# Patient Record
Sex: Male | Born: 1970 | Race: White | Hispanic: No | Marital: Married | Smoking: Current every day smoker
Health system: Southern US, Community
[De-identification: ages and names within clinical notes are randomized; demographics above are authoritative.]

## PROBLEM LIST (undated history)

## (undated) DIAGNOSIS — J449 Chronic obstructive pulmonary disease, unspecified: Secondary | ICD-10-CM

## (undated) HISTORY — PX: TESTICULAR EXPLORATION: SHX5145

---

## 2006-10-26 ENCOUNTER — Ambulatory Visit: Payer: Self-pay | Admitting: Specialist

## 2012-12-24 ENCOUNTER — Ambulatory Visit: Payer: Self-pay

## 2014-07-04 IMAGING — CR DG CHEST 2V
1 series · 3 of 3 positions shown · non-contrast
Comparison: none

REASON FOR EXAM: cough weight loss smoker
COMMENTS:

PROCEDURE:     DAIJA - DAIJA CHEST PA (OR AP) AND LAT  - December 24, 2012  [DATE]
RESULT:

[Series 1: pa · 0.17mm/px · 3 of 3 slices shown]
[im 1/3]
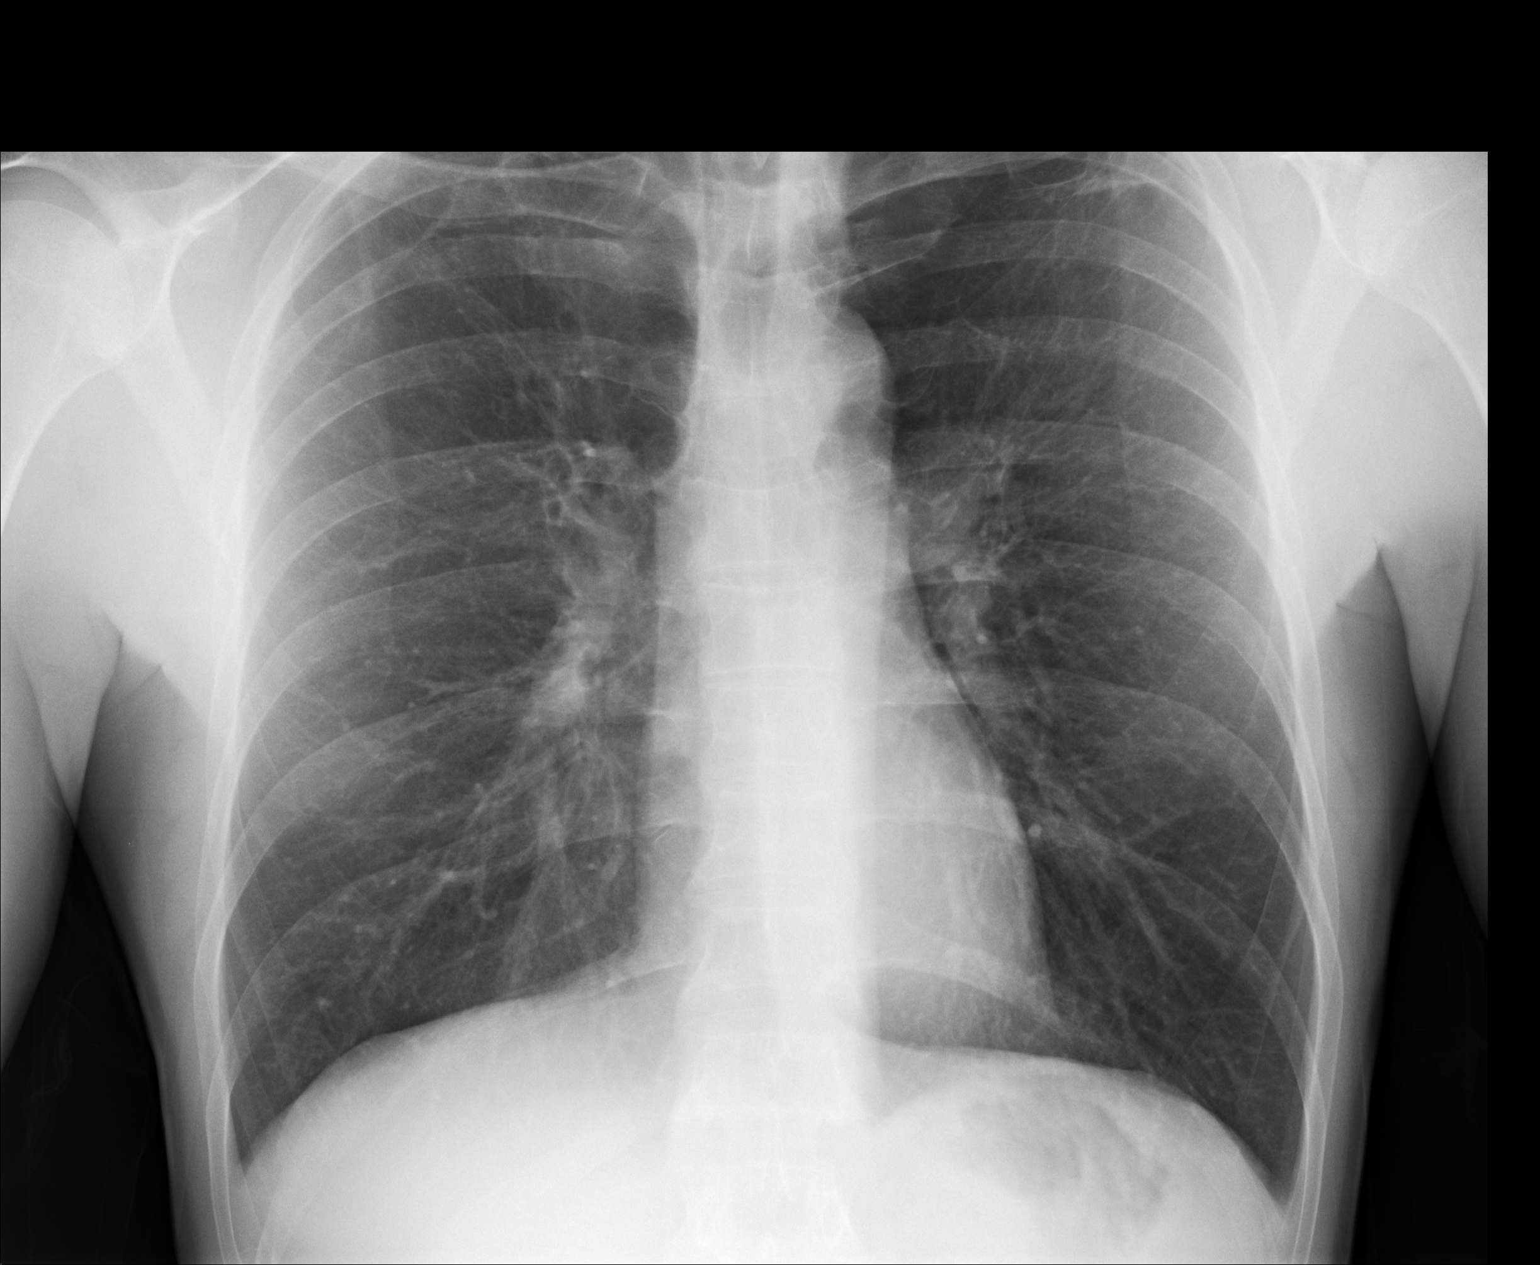
[im 2/3]
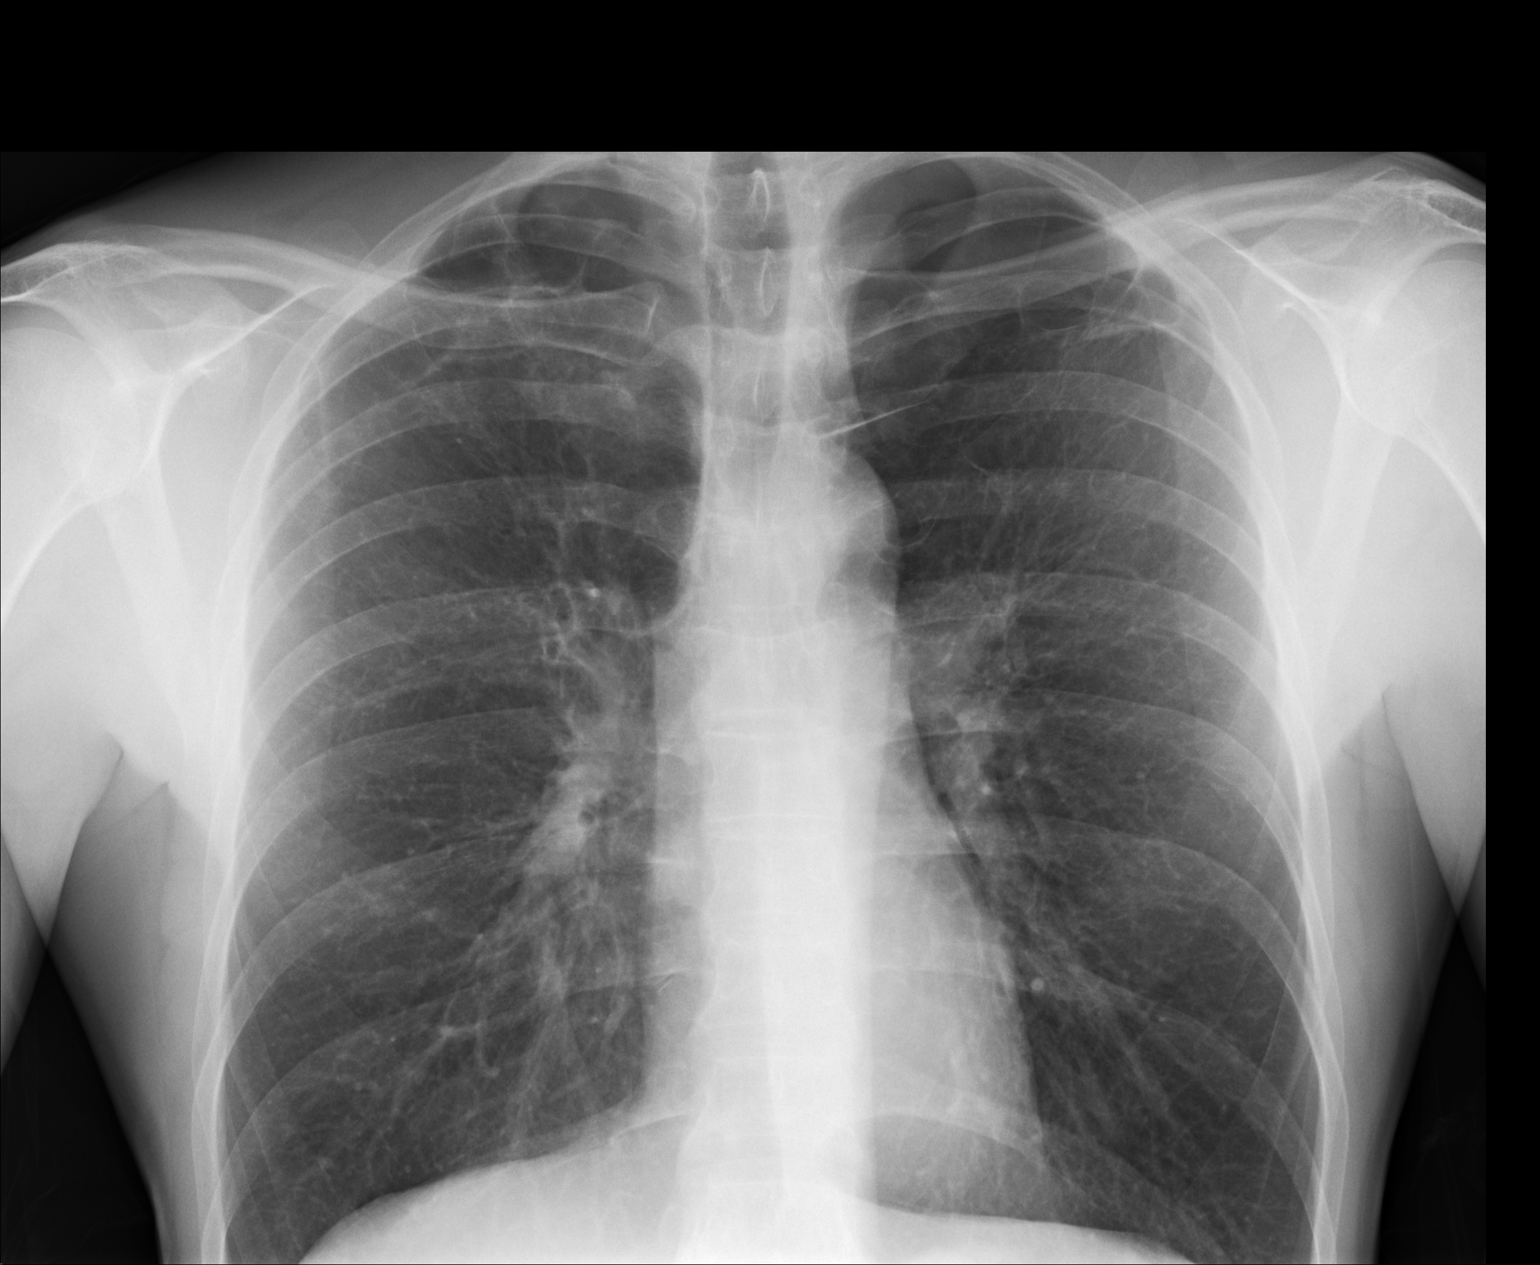
[im 3/3]
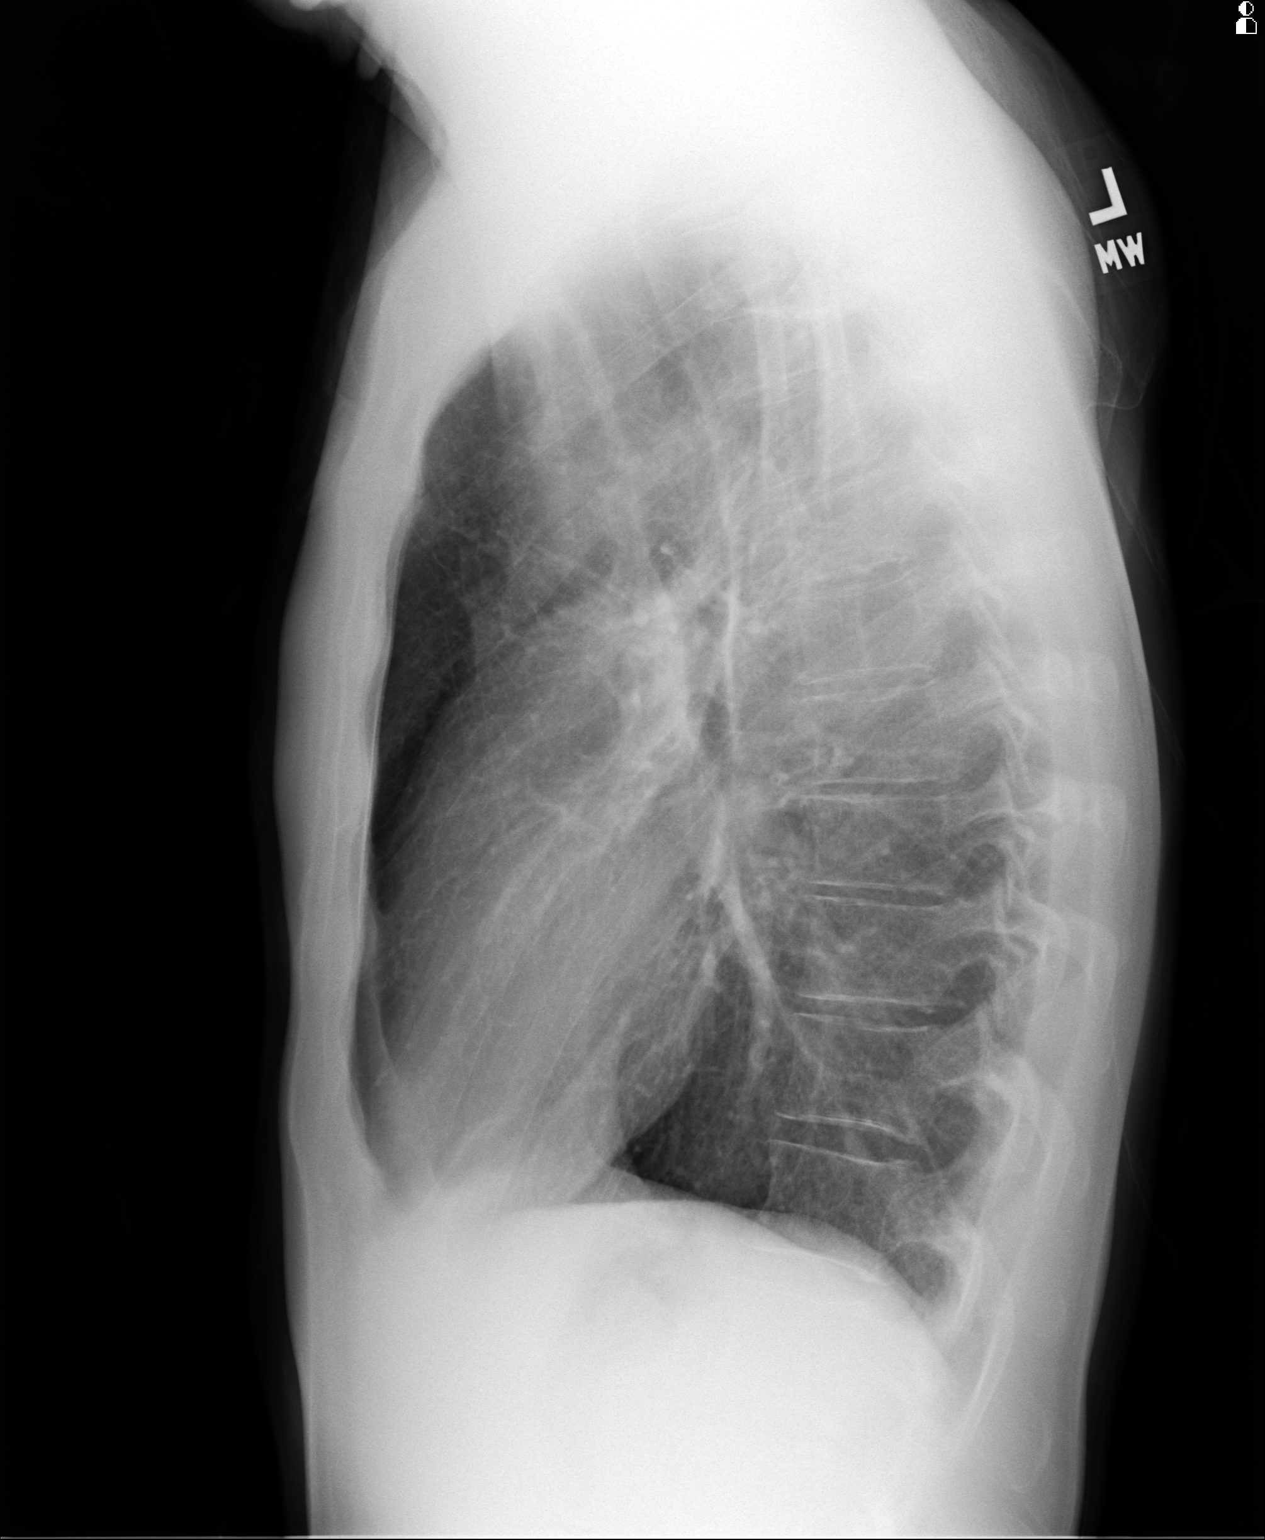

[3 of 3 positions shown; findings below may reference images not displayed]

FINDINGS: Biapical bullous changes are identified within the lung apices.
The lungs are hyperinflated. No focal regions of consolidation or focal
infiltrates are identified. The cardiac silhouette and visualized bony
skeleton are unremarkable.
IMPRESSION: 1. COPD with biapical bullous changes.
2. No evidence of acute cardiopulmonary disease.

## 2021-02-04 ENCOUNTER — Encounter: Payer: Self-pay | Admitting: Emergency Medicine

## 2021-02-04 ENCOUNTER — Other Ambulatory Visit: Payer: Self-pay

## 2021-02-04 ENCOUNTER — Observation Stay
Admission: EM | Admit: 2021-02-04 | Discharge: 2021-02-05 | Disposition: A | Discharge status: Home / Self Care | Payer: Self-pay | Attending: General Surgery | Admitting: General Surgery

## 2021-02-04 ENCOUNTER — Observation Stay: Payer: Self-pay | Admitting: Registered Nurse

## 2021-02-04 ENCOUNTER — Encounter
Admission: EM | Disposition: A | Discharge status: Home / Self Care | Payer: Self-pay | Source: Home / Self Care | Attending: Emergency Medicine

## 2021-02-04 ENCOUNTER — Emergency Department: Payer: Self-pay

## 2021-02-04 DIAGNOSIS — K3580 Unspecified acute appendicitis: Principal | ICD-10-CM | POA: Insufficient documentation

## 2021-02-04 DIAGNOSIS — K352 Acute appendicitis with generalized peritonitis, without abscess: Secondary | ICD-10-CM

## 2021-02-04 DIAGNOSIS — Z20822 Contact with and (suspected) exposure to covid-19: Secondary | ICD-10-CM | POA: Insufficient documentation

## 2021-02-04 DIAGNOSIS — R1031 Right lower quadrant pain: Secondary | ICD-10-CM

## 2021-02-04 DIAGNOSIS — F17210 Nicotine dependence, cigarettes, uncomplicated: Secondary | ICD-10-CM | POA: Insufficient documentation

## 2021-02-04 HISTORY — PX: LAPAROSCOPIC APPENDECTOMY: SHX408

## 2021-02-04 HISTORY — DX: Chronic obstructive pulmonary disease, unspecified: J44.9

## 2021-02-04 LAB — URINALYSIS, COMPLETE (UACMP) WITH MICROSCOPIC
Glucose, UA: NEGATIVE mg/dL
Hgb urine dipstick: NEGATIVE
Nitrite: NEGATIVE
RBC / HPF: NONE SEEN RBC/hpf (ref 0–5)
Specific Gravity, Urine: 1.015 (ref 1.005–1.030)
Squamous Epithelial / HPF: NONE SEEN (ref 0–5)
pH: 6 (ref 5.0–8.0)

## 2021-02-04 LAB — CBC
HCT: 46.2 % (ref 39.0–52.0)
Hemoglobin: 17.1 g/dL — ABNORMAL HIGH (ref 13.0–17.0)
MCH: 36.5 pg — ABNORMAL HIGH (ref 26.0–34.0)
MCHC: 37 g/dL — ABNORMAL HIGH (ref 30.0–36.0)
MCV: 98.5 fL (ref 80.0–100.0)
Platelets: 267 10*3/uL (ref 150–400)
RBC: 4.69 MIL/uL (ref 4.22–5.81)
RDW: 12.6 % (ref 11.5–15.5)
WBC: 18.8 10*3/uL — ABNORMAL HIGH (ref 4.0–10.5)
nRBC: 0 % (ref 0.0–0.2)

## 2021-02-04 LAB — LIPASE, BLOOD: Lipase: 25 U/L (ref 11–51)

## 2021-02-04 LAB — COMPREHENSIVE METABOLIC PANEL
ALT: 18 U/L (ref 0–44)
AST: 25 U/L (ref 15–41)
Albumin: 3.8 g/dL (ref 3.5–5.0)
Alkaline Phosphatase: 83 U/L (ref 38–126)
Anion gap: 18 — ABNORMAL HIGH (ref 5–15)
BUN: 9 mg/dL (ref 6–20)
CO2: 18 mmol/L — ABNORMAL LOW (ref 22–32)
Calcium: 9.3 mg/dL (ref 8.9–10.3)
Chloride: 102 mmol/L (ref 98–111)
Creatinine, Ser: 0.78 mg/dL (ref 0.61–1.24)
GFR, Estimated: >60 mL/min (ref >60)
Glucose, Bld: 203 mg/dL — ABNORMAL HIGH (ref 70–99)
Potassium: 3.2 mmol/L — ABNORMAL LOW (ref 3.5–5.1)
Sodium: 138 mmol/L (ref 135–145)
Total Bilirubin: 0.8 mg/dL (ref 0.3–1.2)
Total Protein: 7.2 g/dL (ref 6.5–8.1)

## 2021-02-04 LAB — RESP PANEL BY RT-PCR (FLU A&B, COVID) ARPGX2
Influenza A by PCR: NEGATIVE
Influenza B by PCR: NEGATIVE
SARS Coronavirus 2 by RT PCR: NEGATIVE

## 2021-02-04 SURGERY — APPENDECTOMY, LAPAROSCOPIC
Anesthesia: General

## 2021-02-04 MED ORDER — LIDOCAINE HCL (PF) 2 % IJ SOLN
INTRAMUSCULAR | Status: AC
  Filled 2021-02-04: qty 5

## 2021-02-04 MED ORDER — DEXAMETHASONE SODIUM PHOSPHATE 10 MG/ML IJ SOLN
INTRAMUSCULAR | Status: AC
  Filled 2021-02-04: qty 1

## 2021-02-04 MED ORDER — PROPOFOL 10 MG/ML IV BOLUS
INTRAVENOUS | Status: AC
  Filled 2021-02-04: qty 20

## 2021-02-04 MED ORDER — SODIUM CHLORIDE 0.9 % IV SOLN
1000.0000 mL | Freq: Once | INTRAVENOUS | Status: AC
  Administered 2021-02-04: 1000 mL via INTRAVENOUS

## 2021-02-04 MED ORDER — LIDOCAINE HCL (CARDIAC) PF 100 MG/5ML IV SOSY
PREFILLED_SYRINGE | INTRAVENOUS | Status: DC | PRN
Start: 2021-02-04 — End: 2021-02-04
  Administered 2021-02-04: 60 mg via INTRAVENOUS

## 2021-02-04 MED ORDER — FENTANYL CITRATE (PF) 100 MCG/2ML IJ SOLN
25.0000 ug | INTRAMUSCULAR | Status: DC | PRN
  Administered 2021-02-04 (×2): 25 ug via INTRAVENOUS
  Administered 2021-02-04: 50 ug via INTRAVENOUS

## 2021-02-04 MED ORDER — OXYCODONE HCL 5 MG/5ML PO SOLN: 5.0000 mg | Freq: Once | ORAL | Status: DC | PRN

## 2021-02-04 MED ORDER — ACETAMINOPHEN 500 MG PO TABS
1000.0000 mg | ORAL_TABLET | Freq: Four times a day (QID) | ORAL | Status: DC
  Administered 2021-02-05 (×2): 1000 mg via ORAL
  Filled 2021-02-04 (×2): qty 2

## 2021-02-04 MED ORDER — ONDANSETRON 4 MG PO TBDP: 4.0000 mg | ORAL_TABLET | Freq: Four times a day (QID) | ORAL | Status: DC | PRN

## 2021-02-04 MED ORDER — POTASSIUM CHLORIDE IN NACL 20-0.9 MEQ/L-% IV SOLN
INTRAVENOUS | Status: DC
  Filled 2021-02-04 (×4): qty 1000

## 2021-02-04 MED ORDER — PROPOFOL 10 MG/ML IV BOLUS
INTRAVENOUS | Status: DC | PRN
  Administered 2021-02-04: 150 mg via INTRAVENOUS

## 2021-02-04 MED ORDER — FENTANYL CITRATE (PF) 100 MCG/2ML IJ SOLN
INTRAMUSCULAR | Status: AC
  Filled 2021-02-04: qty 2

## 2021-02-04 MED ORDER — DEXAMETHASONE SODIUM PHOSPHATE 10 MG/ML IJ SOLN
INTRAMUSCULAR | Status: DC | PRN
  Administered 2021-02-04: 5 mg via INTRAVENOUS

## 2021-02-04 MED ORDER — MORPHINE SULFATE (PF) 4 MG/ML IV SOLN
4.0000 mg | Freq: Once | INTRAVENOUS | Status: AC
Start: 2021-02-04 — End: 2021-02-04
  Administered 2021-02-04: 4 mg via INTRAVENOUS
  Filled 2021-02-04: qty 1

## 2021-02-04 MED ORDER — BUPIVACAINE HCL (PF) 0.25 % IJ SOLN
INTRAMUSCULAR | Status: AC
  Filled 2021-02-04: qty 30

## 2021-02-04 MED ORDER — 0.9 % SODIUM CHLORIDE (POUR BTL) OPTIME
TOPICAL | Status: DC | PRN
  Administered 2021-02-04: 500 mL

## 2021-02-04 MED ORDER — OXYCODONE HCL 5 MG PO TABS
5.0000 mg | ORAL_TABLET | ORAL | Status: DC | PRN
  Administered 2021-02-05: 5 mg via ORAL
  Administered 2021-02-05: 10 mg via ORAL
  Filled 2021-02-04: qty 1
  Filled 2021-02-04: qty 2

## 2021-02-04 MED ORDER — OXYCODONE HCL 5 MG PO TABS
5.0000 mg | ORAL_TABLET | Freq: Once | ORAL | Status: DC | PRN
Start: 2021-02-04 — End: 2021-02-04

## 2021-02-04 MED ORDER — LACTATED RINGERS IV SOLN: INTRAVENOUS | Status: DC | PRN

## 2021-02-04 MED ORDER — MORPHINE SULFATE (PF) 2 MG/ML IV SOLN
2.0000 mg | INTRAVENOUS | Status: DC | PRN
  Administered 2021-02-04: 2 mg via INTRAVENOUS
  Filled 2021-02-04: qty 1

## 2021-02-04 MED ORDER — CHLORHEXIDINE GLUCONATE 0.12 % MT SOLN
OROMUCOSAL | Status: AC
  Administered 2021-02-04: 15 mL via OROMUCOSAL
  Filled 2021-02-04: qty 15

## 2021-02-04 MED ORDER — ACETAMINOPHEN 10 MG/ML IV SOLN: 1000.0000 mg | Freq: Once | INTRAVENOUS | Status: DC | PRN

## 2021-02-04 MED ORDER — DEXMEDETOMIDINE (PRECEDEX) IN NS 20 MCG/5ML (4 MCG/ML) IV SYRINGE
PREFILLED_SYRINGE | INTRAVENOUS | Status: DC | PRN
  Administered 2021-02-04: 20 ug via INTRAVENOUS

## 2021-02-04 MED ORDER — SUGAMMADEX SODIUM 200 MG/2ML IV SOLN
INTRAVENOUS | Status: DC | PRN
  Administered 2021-02-04: 150 mg via INTRAVENOUS

## 2021-02-04 MED ORDER — ROCURONIUM BROMIDE 100 MG/10ML IV SOLN
INTRAVENOUS | Status: DC | PRN
Start: 2021-02-04 — End: 2021-02-04
  Administered 2021-02-04: 10 mg via INTRAVENOUS
  Administered 2021-02-04: 20 mg via INTRAVENOUS

## 2021-02-04 MED ORDER — IOHEXOL 350 MG/ML SOLN
80.0000 mL | Freq: Once | INTRAVENOUS | Status: AC | PRN
  Administered 2021-02-04: 80 mL via INTRAVENOUS
  Filled 2021-02-04: qty 80

## 2021-02-04 MED ORDER — ACETAMINOPHEN 10 MG/ML IV SOLN
INTRAVENOUS | Status: AC
  Filled 2021-02-04: qty 100

## 2021-02-04 MED ORDER — ONDANSETRON HCL 4 MG/2ML IJ SOLN
INTRAMUSCULAR | Status: AC
  Filled 2021-02-04: qty 2

## 2021-02-04 MED ORDER — ONDANSETRON HCL 4 MG/2ML IJ SOLN: 4.0000 mg | Freq: Once | INTRAMUSCULAR | Status: DC | PRN

## 2021-02-04 MED ORDER — LIDOCAINE-EPINEPHRINE 1 %-1:100000 IJ SOLN
INTRAMUSCULAR | Status: DC | PRN
  Administered 2021-02-04: 9 mL via INTRAMUSCULAR

## 2021-02-04 MED ORDER — PHENYLEPHRINE HCL (PRESSORS) 10 MG/ML IV SOLN
INTRAVENOUS | Status: DC | PRN
Start: 2021-02-04 — End: 2021-02-04
  Administered 2021-02-04: 100 ug via INTRAVENOUS
  Administered 2021-02-04: 150 ug via INTRAVENOUS

## 2021-02-04 MED ORDER — EPHEDRINE SULFATE 50 MG/ML IJ SOLN
INTRAMUSCULAR | Status: DC | PRN
  Administered 2021-02-04: 10 mg via INTRAVENOUS

## 2021-02-04 MED ORDER — FENTANYL CITRATE (PF) 100 MCG/2ML IJ SOLN
INTRAMUSCULAR | Status: DC | PRN
  Administered 2021-02-04 (×4): 50 ug via INTRAVENOUS

## 2021-02-04 MED ORDER — ONDANSETRON HCL 4 MG/2ML IJ SOLN
4.0000 mg | Freq: Once | INTRAMUSCULAR | Status: AC
  Administered 2021-02-04: 4 mg via INTRAVENOUS
  Filled 2021-02-04: qty 2

## 2021-02-04 MED ORDER — CHLORHEXIDINE GLUCONATE 0.12 % MT SOLN: 15.0000 mL | Freq: Once | OROMUCOSAL | Status: AC

## 2021-02-04 MED ORDER — PIPERACILLIN-TAZOBACTAM 3.375 G IVPB: 3.3750 g | Freq: Three times a day (TID) | INTRAVENOUS | Status: DC

## 2021-02-04 MED ORDER — PIPERACILLIN-TAZOBACTAM 3.375 G IVPB
INTRAVENOUS | Status: AC
  Administered 2021-02-04: 3.375 g via INTRAVENOUS
  Filled 2021-02-04: qty 50

## 2021-02-04 MED ORDER — SODIUM CHLORIDE 0.9 % IR SOLN
Status: DC | PRN
  Administered 2021-02-04: 1000 mL

## 2021-02-04 MED ORDER — ONDANSETRON HCL 4 MG/2ML IJ SOLN
INTRAMUSCULAR | Status: DC | PRN
  Administered 2021-02-04: 4 mg via INTRAVENOUS

## 2021-02-04 MED ORDER — ACETAMINOPHEN 10 MG/ML IV SOLN
INTRAVENOUS | Status: DC | PRN
  Administered 2021-02-04: 1000 mg via INTRAVENOUS

## 2021-02-04 MED ORDER — ROCURONIUM BROMIDE 10 MG/ML (PF) SYRINGE
PREFILLED_SYRINGE | INTRAVENOUS | Status: AC
  Filled 2021-02-04: qty 10

## 2021-02-04 MED ORDER — HEMOSTATIC AGENTS (NO CHARGE) OPTIME
TOPICAL | Status: DC | PRN
  Administered 2021-02-04: 1 via TOPICAL

## 2021-02-04 MED ORDER — ONDANSETRON HCL 4 MG/2ML IJ SOLN: 4.0000 mg | Freq: Four times a day (QID) | INTRAMUSCULAR | Status: DC | PRN

## 2021-02-04 MED ORDER — MIDAZOLAM HCL 2 MG/2ML IJ SOLN
INTRAMUSCULAR | Status: DC | PRN
  Administered 2021-02-04: 2 mg via INTRAVENOUS

## 2021-02-04 MED ORDER — LIDOCAINE-EPINEPHRINE 1 %-1:100000 IJ SOLN
INTRAMUSCULAR | Status: AC
  Filled 2021-02-04: qty 1

## 2021-02-04 MED ORDER — MIDAZOLAM HCL 2 MG/2ML IJ SOLN
INTRAMUSCULAR | Status: AC
  Filled 2021-02-04: qty 2

## 2021-02-04 SURGICAL SUPPLY — 53 items
APPLICATOR COTTON TIP 6 STRL (MISCELLANEOUS) ×1 IMPLANT
APPLICATOR COTTON TIP 6IN STRL (MISCELLANEOUS) ×2 IMPLANT
APPLIER CLIP 5 13 M/L LIGAMAX5 (MISCELLANEOUS)
BLADE CLIPPER SURG (BLADE) ×2 IMPLANT
BLADE SURG SZ11 CARB STEEL (BLADE) ×2 IMPLANT
CHLORAPREP W/TINT 26 (MISCELLANEOUS) ×2 IMPLANT
CLIP APPLIE 5 13 M/L LIGAMAX5 (MISCELLANEOUS) IMPLANT
CUTTER FLEX LINEAR 45M (STAPLE) ×2 IMPLANT
DEFOGGER SCOPE WARMER CLEARIFY (MISCELLANEOUS) ×2 IMPLANT
DERMABOND ADVANCED (GAUZE/BANDAGES/DRESSINGS) ×1
DERMABOND ADVANCED .7 DNX12 (GAUZE/BANDAGES/DRESSINGS) ×1 IMPLANT
ELECT CAUTERY BLADE TIP 2.5 (TIP) ×2
ELECT REM PT RETURN 9FT ADLT (ELECTROSURGICAL) ×2
ELECTRODE CAUTERY BLDE TIP 2.5 (TIP) ×1 IMPLANT
ELECTRODE REM PT RTRN 9FT ADLT (ELECTROSURGICAL) ×1 IMPLANT
GAUZE 4X4 16PLY ~~LOC~~+RFID DBL (SPONGE) ×2 IMPLANT
GLOVE SURG SYN 6.5 ES PF (GLOVE) ×2 IMPLANT
GLOVE SURG UNDER LTX SZ7 (GLOVE) ×2 IMPLANT
GOWN STRL REUS W/ TWL LRG LVL3 (GOWN DISPOSABLE) ×2 IMPLANT
GOWN STRL REUS W/TWL LRG LVL3 (GOWN DISPOSABLE) ×2
GRASPER SUT TROCAR 14GX15 (MISCELLANEOUS) ×2 IMPLANT
HEMOSTAT SURGICEL 2X3 (HEMOSTASIS) ×2 IMPLANT
IRRIGATION STRYKERFLOW (MISCELLANEOUS) ×1 IMPLANT
IRRIGATOR STRYKERFLOW (MISCELLANEOUS) ×2
IV NS 1000ML (IV SOLUTION) ×1
IV NS 1000ML BAXH (IV SOLUTION) ×1 IMPLANT
KIT TURNOVER KIT A (KITS) ×2 IMPLANT
KITTNER LAPARASCOPIC 5X40 (MISCELLANEOUS) ×2 IMPLANT
LABEL OR SOLS (LABEL) ×2 IMPLANT
MANIFOLD NEPTUNE II (INSTRUMENTS) ×2 IMPLANT
NEEDLE HYPO 22GX1.5 SAFETY (NEEDLE) ×2 IMPLANT
NS IRRIG 500ML POUR BTL (IV SOLUTION) ×2 IMPLANT
PACK LAP CHOLECYSTECTOMY (MISCELLANEOUS) ×2 IMPLANT
PENCIL ELECTRO HAND CTR (MISCELLANEOUS) ×2 IMPLANT
POUCH SPECIMEN RETRIEVAL 10MM (ENDOMECHANICALS) ×2 IMPLANT
RELOAD STAPLE TA45 3.5 REG BLU (ENDOMECHANICALS) ×4 IMPLANT
SCISSORS METZENBAUM CVD 33 (INSTRUMENTS) ×2 IMPLANT
SET TUBE SMOKE EVAC HIGH FLOW (TUBING) ×2 IMPLANT
SHEARS HARMONIC ACE PLUS 36CM (ENDOMECHANICALS) ×2 IMPLANT
SLEEVE ADV FIXATION 5X100MM (TROCAR) ×2 IMPLANT
STRIP CLOSURE SKIN 1/2X4 (GAUZE/BANDAGES/DRESSINGS) ×2 IMPLANT
SUT MNCRL 4-0 (SUTURE) ×1
SUT MNCRL 4-0 27XMFL (SUTURE) ×1
SUT VIC AB 3-0 SH 27 (SUTURE) ×1
SUT VIC AB 3-0 SH 27X BRD (SUTURE) ×1 IMPLANT
SUT VICRYL 0 AB UR-6 (SUTURE) ×2 IMPLANT
SUTURE MNCRL 4-0 27XMF (SUTURE) ×1 IMPLANT
SYS KII FIOS ACCESS ABD 5X100 (TROCAR) ×2
SYSTEM KII FIOS ACES ABD 5X100 (TROCAR) ×1 IMPLANT
TRAY FOLEY MTR SLVR 16FR STAT (SET/KITS/TRAYS/PACK) IMPLANT
TROCAR ADV FIXATION 12X100MM (TROCAR) IMPLANT
TROCAR BALLN GELPORT 12X130M (ENDOMECHANICALS) ×2 IMPLANT
WATER STERILE IRR 500ML POUR (IV SOLUTION) ×2 IMPLANT

## 2021-02-04 NOTE — Progress Notes (Signed)
PATIENT GLASSES AND PHONE CHARGER RETURNED FROM THE INFORMATION DESK

## 2021-02-04 NOTE — OR Nursing (Signed)
Patient voided prior to coming to OR. No foley required per Dr. Lady Gary

## 2021-02-04 NOTE — ED Notes (Signed)
Right lwoer abdominal pain for 2 days.  Unsure of fever.

## 2021-02-04 NOTE — Anesthesia Postprocedure Evaluation (Signed)
Anesthesia Post Note  Patient: Jeffrey Anthony  Procedure(s) Performed: APPENDECTOMY LAPAROSCOPIC  Patient location during evaluation: PACU Anesthesia Type: General Level of consciousness: awake and alert Pain management: pain level controlled Vital Signs Assessment: post-procedure vital signs reviewed and stable Respiratory status: spontaneous breathing, nonlabored ventilation, respiratory function stable and patient connected to nasal cannula oxygen Cardiovascular status: blood pressure returned to baseline and stable Postop Assessment: no apparent nausea or vomiting Anesthetic complications: no   No notable events documented.   Last Vitals:  Vitals:   02/04/21 1830 02/04/21 1848  BP: 125/65 126/86  Pulse: 60 (!) 59  Resp: 17   Temp: 36.5 C (!) 36.4 C  SpO2: 97% 99%    Last Pain:  Vitals:   02/04/21 1855  TempSrc:   PainSc: 4                  Corinda Gubler

## 2021-02-04 NOTE — Op Note (Signed)
Operative Note  Laparoscopic Appendectomy   Jeffrey Anthony Date of operation:  02/04/2021  Indications: The patient presented with a history of  abdominal pain. Workup has revealed findings consistent with acute appendicitis.  Pre-operative Diagnosis: Acute appendicitis with generalized peritonitis  Post-operative Diagnosis: Same  Surgeon: Duanne Guess, MD  Anesthesia: GETA  Findings: inflamed appendix without abscess or perforation  Estimated Blood Loss: <2cc         Specimens: appendix         Complications:  none immediately apparent  Procedure Details  The patient was seen again in the preop area. The options of surgery versus observation were reviewed with the patient and/or family. The risks of bleeding, infection, recurrence of symptoms, negative laparoscopy, potential for an open procedure, bowel injury, abscess or infection, were all reviewed as well. The patient was taken to Operating Room, identified as Jeffrey Anthony and the procedure verified as laparoscopic appendectomy. A time out was performed and the above information confirmed.  The patient was placed in the supine position and general anesthesia was induced.  Antibiotic prophylaxis was administered and VTE prophylaxis was in place.   The abdomen was prepped and draped in a sterile fashion. A supraumbilical incision was made. A cutdown technique was used to enter the abdominal cavity. Two vicryl stitches were placed on the fascia and a Hasson trocar inserted. Pneumoperitoneum obtained. Two 5 mm ports were placed under direct visualization.  The appendix was identified and found to be acutely inflamed, but without abscess or evidence of perforation. The appendix was carefully dissected out from the surrounding tissues. The mesoappendix was divided with the Harmonic scalpel. The base of the appendix was dissected out and divided with a standard load Endo GIA x 2 loads.The appendix was placed in a Endo Catch bag and  removed via the Hasson port. The right lower quadrant and pelvis was then irrigated with normal saline which was then aspirated. The right lower quadrant was inspected there was no sign of bleeding or bowel injury therefore pneumoperitoneum was released, all ports were removed.  The umbilical fascia was closed with 0 Vicryl interrupted sutures and the skin incisions were approximated with subcuticular 4-0 Monocryl. Dermabond was applied The patient tolerated the procedure well and there were no immediately apparent complications. The sponge lap and needle count were correct at the end of the procedure.  The patient was taken to the recovery room in stable condition to be admitted for continued care.   Duanne Guess, MD, FACS

## 2021-02-04 NOTE — ED Provider Notes (Addendum)
-----------------------------------------   12:12 PM on 02/04/2021 -----------------------------------------  CT has resulted on acute appendicitis without perforation or abscess.  Lab work shows leukocytosis of 18,000.  Patient drank at 8 AM this morning last food intake was 8 PM last night.  We will discussed with general surgery.  I have updated the patient on the results.  Surgery will be admitting to their service.  COVID-negative.   Minna Antis, MD 02/04/21 1347    Minna Antis, MD 02/04/21 1348

## 2021-02-04 NOTE — Anesthesia Preprocedure Evaluation (Addendum)
Anesthesia Evaluation  Patient identified by MRN, date of birth, ID band Patient awake    Reviewed: Allergy & Precautions, NPO status , Patient's Chart, lab work & pertinent test results  History of Anesthesia Complications Negative for: history of anesthetic complications  Airway Mallampati: II  TM Distance: >3 FB Neck ROM: Full    Dental  (+) Poor Dentition, Chipped, Missing   Pulmonary neg sleep apnea, COPD, Current Smoker and Patient abstained from smoking.,    Pulmonary exam normal breath sounds clear to auscultation       Cardiovascular Exercise Tolerance: Good METS(-) hypertension(-) CAD and (-) Past MI negative cardio ROS  (-) dysrhythmias  Rhythm:Regular Rate:Normal - Systolic murmurs    Neuro/Psych negative neurological ROS  negative psych ROS   GI/Hepatic neg GERD  ,(+)     substance abuse  alcohol use, 3-4 beers/day   Endo/Other  neg diabetes  Renal/GU negative Renal ROS     Musculoskeletal   Abdominal   Peds  Hematology   Anesthesia Other Findings Past Medical History: No date: COPD (chronic obstructive pulmonary disease) (HCC)  Reproductive/Obstetrics                            Anesthesia Physical Anesthesia Plan  ASA: 2  Anesthesia Plan: General   Post-op Pain Management:    Induction: Intravenous  PONV Risk Score and Plan: 3 and Ondansetron, Dexamethasone and Midazolam  Airway Management Planned: Oral ETT  Additional Equipment: None  Intra-op Plan:   Post-operative Plan: Extubation in OR  Informed Consent: I have reviewed the patients History and Physical, chart, labs and discussed the procedure including the risks, benefits and alternatives for the proposed anesthesia with the patient or authorized representative who has indicated his/her understanding and acceptance.     Dental advisory given  Plan Discussed with: CRNA and Surgeon  Anesthesia  Plan Comments: (Discussed risks of anesthesia with patient, including PONV, sore throat, lip/dental damage. Rare risks discussed as well, such as cardiorespiratory and neurological sequelae, and allergic reactions. Patient understands. Patient counseled on benefits of smoking cessation, and increased perioperative risks associated with continued smoking. )        Anesthesia Quick Evaluation

## 2021-02-04 NOTE — H&P (Addendum)
[facility] SURGICAL HISTORY & PHYSICAL (cpt [phone])  HISTORY OF PRESENT ILLNESS (HPI):  50 y.o. male presented to [facility] ED this morning for abdominal pain. Patient reports a three day history of constant RLQ abdominal pain. This has been sharp in nature and progressively worsening since the onset. Nothing seems to make this better. He endorses associated nausea. No fever, chills, cough, CP, SOB, emesis, or bowel changes. No history of similar. No previous intra-abdominal surgeries. Work up in the ED revealed a leukocytosis to 18.8K and mild hypokalemia to 3.2. CT Abdomen/Pelvis was concerning for acute uncomplicated appendicitis.   General surgery is consulted by emergency medicine physician Dr Minna Antis, MD for evaluation and management of acute appendicitis.   PAST MEDICAL HISTORY (PMH):  History reviewed. No pertinent past medical history.  Reviewed. Otherwise negative.   PAST SURGICAL HISTORY (PSH):  Reviewed. Otherwise negative.   MEDICATIONS:  Prior to Admission medications   Not on File     ALLERGIES:  No Known Allergies   SOCIAL HISTORY:  Social History   Socioeconomic History   Marital status: Married    Spouse name: Not on file   Number of children: Not on file   Years of education: Not on file   Highest education level: Not on file  Occupational History   Not on file  Tobacco Use   Smoking status: Every Day    Types: Cigarettes   Smokeless tobacco: Never  Substance and Sexual Activity   Alcohol use: Yes   Drug use: Not on file   Sexual activity: Not on file  Other Topics Concern   Not on file  Social History Narrative   Not on file   Social Determinants of Health   Financial Resource Strain: Not on file  Food Insecurity: Not on file  Transportation Needs: Not on file  Physical Activity: Not on file  Stress: Not on file  Social Connections: Not on file  Intimate Partner Violence: Not on file     FAMILY HISTORY:  No family  history on file.  Otherwise negative.   REVIEW OF SYSTEMS:  Review of Systems  Constitutional:  Negative for chills and fever.  HENT:  Negative for congestion and sore throat.   Respiratory:  Negative for cough and shortness of breath.   Cardiovascular:  Negative for chest pain and palpitations.  Gastrointestinal:  Positive for abdominal pain and nausea. Negative for constipation, diarrhea and vomiting.  Genitourinary:  Negative for dysuria and urgency.  All other systems reviewed and are negative.  VITAL SIGNS:  Temp:  [98.1 F (36.7 C)] 98.1 F (36.7 C) (09/29 0901) Pulse Rate:  [87-100] 87 (09/29 1100) Resp:  [18] 18 (09/29 1100) BP: (125-146)/(81-94) 127/81 (09/29 1100) SpO2:  [94 %-96 %] 94 % (09/29 1100) Weight:  [65.8 kg] 65.8 kg (09/29 0855)     Height: 5\' 10"  (177.8 cm) Weight: 65.8 kg BMI (Calculated): 20.81   PHYSICAL EXAM:  Physical Exam Vitals and nursing note reviewed. Exam conducted with a chaperone present.  Constitutional:      General: He is not in acute distress.    Appearance: He is well-developed and normal weight. He is not ill-appearing.  HENT:     Head: Normocephalic and atraumatic.  Eyes:     General: No scleral icterus.    Extraocular Movements: Extraocular movements intact.  Cardiovascular:     Rate and Rhythm: Normal rate and regular rhythm.  Pulmonary:     Effort: Pulmonary effort is normal. No  respiratory distress.  Abdominal:     General: Abdomen is flat. There is no distension.     Palpations: Abdomen is soft.     Tenderness: There is abdominal tenderness. There is no guarding or rebound. Positive signs include McBurney's sign. Negative signs include Rovsing's sign.     Hernia: No hernia is present.     Comments: Abdomen is soft, RLQ tenderness with positive McBurney's Point, non-distended, no rebound/guarding   Genitourinary:    Comments: Deferred Skin:    General: Skin is warm and dry.     Coloration: Skin is not jaundiced or pale.   Neurological:     General: No focal deficit present.     Mental Status: He is alert and oriented to person, place, and time.  Psychiatric:        Mood and Affect: Mood normal.        Behavior: Behavior normal.    INTAKE/OUTPUT:  This shift: No intake/output data recorded.  Last 2 shifts: @IOLAST2SHIFTS @  Labs:  CBC Latest Ref Rng & Units 02/04/2021  WBC 4.0 - 10.5 K/uL 18.8(H)  Hemoglobin 13.0 - 17.0 g/dL 17.1(H)  Hematocrit 39.0 - 52.0 % 46.2  Platelets 150 - 400 K/uL 267   CMP Latest Ref Rng & Units 02/04/2021  Glucose 70 - 99 mg/dL 02/06/2021)  BUN 6 - 20 mg/dL 9  Creatinine 008(Q - 7.61 mg/dL 9.50  Sodium 9.32 - 671 mmol/L 138  Potassium 3.5 - 5.1 mmol/L 3.2(L)  Chloride 98 - 111 mmol/L 102  CO2 22 - 32 mmol/L 18(L)  Calcium 8.9 - 10.3 mg/dL 9.3  Total Protein 6.5 - 8.1 g/dL 7.2  Total Bilirubin 0.3 - 1.2 mg/dL 0.8  Alkaline Phos 38 - 126 U/L 83  AST 15 - 41 U/L 25  ALT 0 - 44 U/L 18     Imaging studies:   CT Abdomen/Pelvis (02/04/2021) personally reviewed with dilated and inflamed appendix, ? Appendicolith, no abscess or free air, and radiologist report reviewed below:  IMPRESSION: 1. Acute appendicitis without evidence of perforation or abscess. 2. Mild L2 compression fracture of uncertain chronicity.    Assessment/Plan: (ICD-10's: K73.80) 50 y.o. male with leukocytosis and RLQ abdominal pain concerning for acute uncomplicated appendicitis   - Admit to general surgery - Will plan on laparoscopic appendectomy this afternoon with Dr 44 pending OR/Anesthesia availability - All risks, benefits, and alternatives to above procedure(s) were discussed with the patient, all of his questions were answered to his expressed satisfaction, patient expresses he wishes to proceed, and informed consent was obtained.    - NPO + IVF Resuscitation - IV Abx (Zosyn)   - Monitor abdominal examination; on-going bowel function - Pain control prn; antiemetics prn   - DVT prophylaxis;  hold for OR  All of the above findings and recommendations were discussed with the patient, and all of his questions were answered to his expressed satisfaction.  -- Lady Gary, PA-C [facility] 02/04/2021, 2:04 PM [phone] M-F: 7am - 4pm  I saw and evaluated the patient.  I agree with the above documentation, exam, and plan, which I have edited where appropriate. [phone]  4:11 PM

## 2021-02-04 NOTE — ED Triage Notes (Signed)
C/O RLQ abdominal pain x 2 days.  States some N/V with symptoms.

## 2021-02-04 NOTE — ED Provider Notes (Signed)
[facility] Emergency Department Provider Note   ____________________________________________    I have reviewed the triage vital signs and the nursing notes.   HISTORY  Chief Complaint Abdominal Pain     HPI Jeffrey Anthony is a 50 y.o. male who presents with complaints of right lower quadrant abdominal pain.  Patient reports this is been worsening over the last 3 days.  He reports the pain is moderate to severe.  No history of this in the past.  No history of abdominal surgery.  Has not take anything for this.  Positive nausea decreased p.o. intake.  Does not think that he has had fevers.  No flank pain.  History reviewed. No pertinent past medical history.  There are no problems to display for this patient.   No surgical history  Prior to Admission medications   Not on File     Allergies Patient has no known allergies.  No family history on file.  Social History Social History   Tobacco Use   Smoking status: Every Day    Types: Cigarettes   Smokeless tobacco: Never  Substance Use Topics   Alcohol use: Yes    Review of Systems Constitutional: No fever/chills Eyes: No visual changes.  ENT: No sore throat. Cardiovascular: Denies chest pain. Respiratory: Denies shortness of breath. Gastrointestinal: As above Genitourinary: Negative for dysuria. Musculoskeletal: Negative for back pain. Skin: Negative for rash. Neurological: Negative for headaches or weakness   ____________________________________________   PHYSICAL EXAM:  VITAL SIGNS: ED Triage Vitals  Enc Vitals Group     BP 02/04/21 0901 (!) 146/94     Pulse Rate 02/04/21 0901 100     Resp 02/04/21 0901 18     Temp 02/04/21 0901 98.1 F (36.7 C)     Temp Source 02/04/21 0901 Oral     SpO2 02/04/21 0901 96 %     Weight 02/04/21 0855 65.8 kg (145 lb)     Height 02/04/21 0855 1.778 m (5\' 10" )     Head Circumference --      Peak Flow --      Pain Score 02/04/21 0855  4     Pain Loc --      Pain Edu? --      Excl. in GC? --     Constitutional: Alert and oriented.  Eyes: Conjunctivae are normal.   Mouth/Throat: Mucous membranes are moist.    Cardiovascular: Normal rate, regular rhythm. Grossly normal heart sounds.  Good peripheral circulation. Respiratory: Normal respiratory effort.  No retractions. Lungs CTAB. Gastrointestinal: Soft, right lower quadrant tenderness to palpation, no distention, no CVA tenderness Genitourinary: No hernia Musculoskeletal:  Warm and well perfused Neurologic:  Normal speech and language. No gross focal neurologic deficits are appreciated.  Skin:  Skin is warm, dry and intact. No rash noted. Psychiatric: Mood and affect are normal. Speech and behavior are normal.  ____________________________________________   LABS (all labs ordered are listed, but only abnormal results are displayed)  Labs Reviewed  COMPREHENSIVE METABOLIC PANEL - Abnormal; Notable for the following components:      Result Value   Potassium 3.2 (*)    CO2 18 (*)    Glucose, Bld 203 (*)    Anion gap 18 (*)    All other components within normal limits  CBC - Abnormal; Notable for the following components:   WBC 18.8 (*)    Hemoglobin 17.1 (*)    MCH 36.5 (*)    MCHC 37.0 (*)  All other components within normal limits  URINALYSIS, COMPLETE (UACMP) WITH MICROSCOPIC - Abnormal; Notable for the following components:   Bilirubin Urine SMALL (*)    Ketones, ur TRACE (*)    Protein, ur TRACE (*)    Leukocytes,Ua TRACE (*)    Bacteria, UA RARE (*)    All other components within normal limits  RESP PANEL BY RT-PCR (FLU A&B, COVID) ARPGX2  LIPASE, BLOOD   ____________________________________________  EKG   ____________________________________________  RADIOLOGY  CT abdomen pelvis ____________________________________________   PROCEDURES  Procedure(s) performed: No  Procedures   Critical Care performed:  No ____________________________________________   INITIAL IMPRESSION / ASSESSMENT AND PLAN / ED COURSE  Pertinent labs & imaging results that were available during my care of the patient were reviewed by me and considered in my medical decision making (see chart for details).   Patient presents with abdominal pain as described above, highly suspicious for appendicitis.  Will treat with IV morphine, IV Zofran.  Lab work notable for white blood cell count of 18.8, CT abdomen pelvis pending  Have asked my colleague to f/u on CT abd/pelvis    ____________________________________________   FINAL CLINICAL IMPRESSION(S) / ED DIAGNOSES  Final diagnoses:  Right lower quadrant abdominal pain        Note:  This document was prepared using Dragon voice recognition software and may include unintentional dictation errors.    Jene Every, MD 02/04/21 [phone]

## 2021-02-04 NOTE — ED Notes (Signed)
Report given to OR at this time.

## 2021-02-04 NOTE — Anesthesia Procedure Notes (Signed)
Procedure Name: Intubation Date/Time: 02/04/2021 4:26 PM Performed by: Fredderick Phenix, CRNA Pre-anesthesia Checklist: Patient identified, Emergency Drugs available, Suction available and Patient being monitored Patient Re-evaluated:Patient Re-evaluated prior to induction Oxygen Delivery Method: Circle system utilized Preoxygenation: Pre-oxygenation with 100% oxygen Induction Type: IV induction Ventilation: Mask ventilation without difficulty Laryngoscope Size: Mac and 3 Grade View: Grade I Tube type: Oral Tube size: 7.5 mm Number of attempts: 1 Airway Equipment and Method: Stylet and Oral airway Placement Confirmation: ETT inserted through vocal cords under direct vision, positive ETCO2 and breath sounds checked- equal and bilateral Secured at: 21 cm Tube secured with: Tape Dental Injury: Teeth and Oropharynx as per pre-operative assessment

## 2021-02-04 NOTE — Transfer of Care (Signed)
Immediate Anesthesia Transfer of Care Note  Patient: Jeffrey Anthony  Procedure(s) Performed: APPENDECTOMY LAPAROSCOPIC  Patient Location: PACU  Anesthesia Type:General  Level of Consciousness: awake and alert   Airway & Oxygen Therapy: Patient Spontanous Breathing and Patient connected to face mask oxygen  Post-op Assessment: Report given to RN and Post -op Vital signs reviewed and stable  Post vital signs: Reviewed and stable  Last Vitals:  Vitals Value Taken Time  BP 93/56 02/04/21 1724  Temp    Pulse 58 02/04/21 1726  Resp 12 02/04/21 1726  SpO2 98 % 02/04/21 1726  Vitals shown include unvalidated device data.  Last Pain:  Vitals:   02/04/21 1500  TempSrc: Temporal  PainSc: 7          Complications: No notable events documented.

## 2021-02-05 ENCOUNTER — Encounter: Payer: Self-pay | Admitting: General Surgery

## 2021-02-05 LAB — CBC
HCT: 39.9 % (ref 39.0–52.0)
Hemoglobin: 14.4 g/dL (ref 13.0–17.0)
MCH: 36.4 pg — ABNORMAL HIGH (ref 26.0–34.0)
MCHC: 36.1 g/dL — ABNORMAL HIGH (ref 30.0–36.0)
MCV: 100.8 fL — ABNORMAL HIGH (ref 80.0–100.0)
Platelets: 235 10*3/uL (ref 150–400)
RBC: 3.96 MIL/uL — ABNORMAL LOW (ref 4.22–5.81)
RDW: 12.2 % (ref 11.5–15.5)
WBC: 12.1 10*3/uL — ABNORMAL HIGH (ref 4.0–10.5)
nRBC: 0 % (ref 0.0–0.2)

## 2021-02-05 LAB — BASIC METABOLIC PANEL
Anion gap: 6 (ref 5–15)
BUN: 8 mg/dL (ref 6–20)
CO2: 25 mmol/L (ref 22–32)
Calcium: 8.5 mg/dL — ABNORMAL LOW (ref 8.9–10.3)
Chloride: 106 mmol/L (ref 98–111)
Creatinine, Ser: 0.83 mg/dL (ref 0.61–1.24)
GFR, Estimated: >60 mL/min (ref >60)
Glucose, Bld: 127 mg/dL — ABNORMAL HIGH (ref 70–99)
Potassium: 4.6 mmol/L (ref 3.5–5.1)
Sodium: 137 mmol/L (ref 135–145)

## 2021-02-05 MED ORDER — IBUPROFEN 600 MG PO TABS: 600.0000 mg | ORAL_TABLET | Freq: Four times a day (QID) | ORAL | 0 refills | Status: AC | PRN

## 2021-02-05 MED ORDER — OXYCODONE HCL 5 MG PO TABS: 5.0000 mg | ORAL_TABLET | ORAL | 0 refills | Status: DC | PRN

## 2021-02-05 NOTE — Discharge Summary (Signed)
[facility] SUR[facility]ICAL DISCHARGE SUMMARY  Patient ID: Jeffrey Anthony MRN: [id number] DOB/AGE: 09-07-1970 50 y.o.  Admit date: 02/04/2021 Discharge date: 02/05/2021  Discharge Diagnoses Patient Active Problem List   Diagnosis Date Noted   Acute appendicitis 02/04/2021    Consultants None  Procedures 02/04/2021:  Laparoscopic Appendectomy  HPI: 50 y.o. male presented to [facility] ED this morning for abdominal pain. Patient reports a three day history of constant RLQ abdominal pain. This has been sharp in nature and progressively worsening since the onset. Nothing seems to make this better. He endorses associated nausea. No fever, chills, cough, CP, SOB, emesis, or bowel changes. No history of similar. No previous intra-abdominal surgeries. Work up in the ED revealed a leukocytosis to 18.8K and mild hypokalemia to 3.2. CT Abdomen/Pelvis was concerning for acute uncomplicated appendicitis.   Anthony Course: Informed consent was obtained and documented, and patient underwent uneventful laparoscopic appendectomy (Dr Jeffrey Anthony 02/04/2021).  Post-operatively, patient's pain improved/resolved and advancement of patient's diet and ambulation were well-tolerated. The remainder of patient's Anthony course was essentially unremarkable, and discharge planning was initiated accordingly with patient safely able to be discharged home with appropriate discharge instructions, pain control, and outpatient follow-up after all of his questions were answered to his expressed satisfaction.   Discharge Condition: Good   Physical Examination:  Constitutional: Well appearing male, NAD Pulmonary: Normal effort, no respiratory distress Gastrointestinal: Soft, incisional soreness, non-distended, no rebound/guarding Skin: Laparoscopic incisions are CDI with steri-strips, no erythema or drainage    Allergies as of 02/05/2021   No Known Allergies      Medication List     TAKE these medications     acetaminophen 325 MG tablet Commonly known as: TYLENOL Take 650 mg by mouth every 6 (six) hours as needed.   ibuprofen 600 MG tablet Commonly known as: ADVIL Take 1 tablet (600 mg total) by mouth every 6 (six) hours as needed.   oxyCODONE 5 MG immediate release tablet Commonly known as: Oxy IR/ROXICODONE Take 1 tablet (5 mg total) by mouth every 4 (four) hours as needed for severe pain or breakthrough pain.          Follow-up Information     Jeffrey Kail, PA-C. Schedule an appointment as soon as possible for a visit in 2 week(s).   Specialty: Physician Assistant Why: s/p lap appy Contact information: [address] [place] [address] [phone]                  Time spent on discharge management including discussion of Anthony course, clinical condition, outpatient instructions, prescriptions, and follow up with the patient and members of the medical team: >330 minutes  -- Jeffrey Anthony , PA-C [facility]  02/05/2021, 9:46 AM [phone] M-F: 7am - 4pm

## 2021-02-05 NOTE — TOC Transition Note (Signed)
Transition of Care [facility]) - CM/SW Discharge Note   Patient Details  Name: Jeffrey Anthony MRN: [id number] Date of Birth: Jan 29, 1971  Transition of [facility]) CM/SW Contact:  Candie Chroman, LCSW Phone Number: 02/05/2021, 10:23 AM   Clinical Narrative: Patient has orders to discharge home today. CSW met with patient. No supports at bedside. CSW introduced role and explained that discharge planning would be discussed. Patient confirmed he does not have insurance or a PCP. Gave [employer]Rx coupon for Ibuprofen. [facility] did not have coupon for Oxycodone. Gave packet for free/low-cost healthcare in [facility] and intake paperwork for [employer]. No further concerns. CSW signing off.    Final next level of care: Home/Self Care Barriers to Discharge: No Barriers Identified   Patient Goals and CMS Choice        Discharge Placement                Patient to be transferred to facility by: Says he has a ride.   Patient and family notified of of transfer: 02/05/21  Discharge Plan and Services                                     Social Determinants of Health (SDOH) Interventions     Readmission Risk Interventions No flowsheet data found.

## 2021-02-05 NOTE — Progress Notes (Signed)
Patient discharged from unit accompanied by family. Refused wheelchair assistance.  No acute distress noted.  Patient discharged with all pertinent information, prescriptions, and personal belongings.  Patient able to teach back discharge instructions.  IV d/ced.  Care relinquished.

## 2021-02-05 NOTE — Discharge Instructions (Signed)
In addition to included general post-operative instructions,  Diet: Resume home diet.   Activity: No heavy lifting >20 pounds (children, pets, laundry, garbage) or strenuous activity for 4 weeks, but light activity and walking are encouraged. Do not drive or drink alcohol if taking narcotic pain medications or having pain that might distract from driving.  Wound care: 2 days after surgery (10/01), you may shower/get incision wet with soapy water and pat dry (do not rub incisions), but no baths or submerging incision underwater until follow-up.   Medications: Resume all home medications. For mild to moderate pain: acetaminophen (Tylenol) or ibuprofen/naproxen (if no kidney disease). Combining Tylenol with alcohol can substantially increase your risk of causing liver disease. Narcotic pain medications, if prescribed, can be used for severe pain, though may cause nausea, constipation, and drowsiness. Do not combine Tylenol and Percocet (or similar) within a 6 hour period as Percocet (and similar) contain(s) Tylenol. If you do not need the narcotic pain medication, you do not need to fill the prescription.  Call office [phone] / [phone]) at any time if any questions, worsening pain, fevers/chills, bleeding, drainage from incision site, or other concerns.

## 2021-02-06 ENCOUNTER — Encounter: Payer: Self-pay | Admitting: Family Medicine

## 2021-02-08 ENCOUNTER — Telehealth: Payer: Self-pay | Admitting: *Deleted

## 2021-02-08 ENCOUNTER — Other Ambulatory Visit: Payer: Self-pay | Admitting: Physician Assistant

## 2021-02-08 LAB — SURGICAL PATHOLOGY

## 2021-02-08 MED ORDER — TRAMADOL HCL 50 MG PO TABS: 50.0000 mg | ORAL_TABLET | Freq: Four times a day (QID) | ORAL | 0 refills | Status: DC | PRN

## 2021-02-08 NOTE — Telephone Encounter (Signed)
Patient called and had his appendix removed by Dr.Cannon on 02/04/21. He was givien oxycodone but when he took it he started itching all over, the last time he took it was Friday. He would like to get something else called in. Please call and advise

## 2021-02-08 NOTE — Telephone Encounter (Signed)
Sent a message to Lynden Oxford, PA-C. He has sent in a prescription for Tramadol. Spoke with the patient and let him know that this has been done. He states that the itching has stopped. He had taken some Benadryl for this and it did work for him. I have added Oxycodone to his allergy list.

## 2021-02-08 NOTE — Telephone Encounter (Signed)
Patient has not yet established here at [facility] yet he has new patient apt coming up.  This question is in reference to his recent appendectomy. He has already contacted the general surgery office and they have resolved this issue.  Closing this message now  Saralyn Pilar, DO [facility] 02/08/2021, 5:29 PM

## 2021-02-12 ENCOUNTER — Ambulatory Visit: Payer: Self-pay | Admitting: Family Medicine

## 2021-02-18 ENCOUNTER — Encounter: Payer: Self-pay | Admitting: Physician Assistant

## 2021-02-18 ENCOUNTER — Ambulatory Visit (INDEPENDENT_AMBULATORY_CARE_PROVIDER_SITE_OTHER): Payer: Self-pay | Admitting: Physician Assistant

## 2021-02-18 ENCOUNTER — Other Ambulatory Visit: Payer: Self-pay

## 2021-02-18 VITALS — BP 148/85 | HR 92 | Temp 98.1°F | Ht 70.0 in | Wt 139.6 lb

## 2021-02-18 DIAGNOSIS — Z09 Encounter for follow-up examination after completed treatment for conditions other than malignant neoplasm: Secondary | ICD-10-CM

## 2021-02-18 DIAGNOSIS — K352 Acute appendicitis with generalized peritonitis, without abscess: Secondary | ICD-10-CM

## 2021-02-18 DIAGNOSIS — K353 Acute appendicitis with localized peritonitis, without perforation or gangrene: Secondary | ICD-10-CM

## 2021-02-18 NOTE — Patient Instructions (Signed)

## 2021-02-18 NOTE — Progress Notes (Signed)
[facility] POST-OP OFFICE VISIT  02/18/2021  HPI: Jeffrey Anthony is a 50 y.o. male 14 days s/p laparoscopic appendectomy for acute appendicitis with Dr Lady Gary.   He is doing very well at home He is having minimal pain, only needing ibuprofen intermittently No fever, chills, nausea, emesis Some itching at the incisions Tolerating PO and having normal bowel function No other complaints.  Vital signs: BP (!) 148/85   Pulse 92   Temp 98.1 F (36.7 C) (Oral)   Ht 5\' 10"  (1.778 m)   Wt 139 lb 9.6 oz (63.3 kg)   SpO2 96%   BMI 20.03 kg/m    Physical Exam: Constitutional: Well appearing male, NAD Abdomen: Soft, non-tender, non-distended, no rebound/guarding Skin: Laparoscopic incisions are healing well, some excoriation to the suprapubic incision from itching, no drainage  Assessment/Plan: This is a 50 y.o. male 14 days s/p laparoscopic appendectomy for acute appendicitis    - Pain control prn  - Reviewed wound care: encouraged cessation of itching   - Reviewed lifting restrictions; work note given  - Reviewed surgical pathology; appendicitis  - He can rtc on as needed basis   -- 43, PA-C [facility] 02/18/2021, 10:49 AM [phone] M-F: 7am - 4pm

## 2021-03-09 ENCOUNTER — Ambulatory Visit: Payer: Self-pay | Admitting: Family Medicine

## 2021-05-11 ENCOUNTER — Ambulatory Visit: Payer: Self-pay | Admitting: Family Medicine

## 2021-06-07 ENCOUNTER — Emergency Department
Admission: EM | Admit: 2021-06-07 | Discharge: 2021-06-07 | Disposition: A | Discharge status: Home / Self Care | Payer: No Typology Code available for payment source | Attending: Emergency Medicine | Admitting: Emergency Medicine

## 2021-06-07 ENCOUNTER — Emergency Department: Payer: No Typology Code available for payment source

## 2021-06-07 ENCOUNTER — Other Ambulatory Visit: Payer: Self-pay

## 2021-06-07 DIAGNOSIS — J449 Chronic obstructive pulmonary disease, unspecified: Secondary | ICD-10-CM | POA: Diagnosis not present

## 2021-06-07 DIAGNOSIS — M940 Chondrocostal junction syndrome [Tietze]: Secondary | ICD-10-CM | POA: Diagnosis not present

## 2021-06-07 DIAGNOSIS — R079 Chest pain, unspecified: Secondary | ICD-10-CM | POA: Diagnosis present

## 2021-06-07 MED ORDER — HYDROCODONE-ACETAMINOPHEN 5-325 MG PO TABS: 1.0000 | ORAL_TABLET | Freq: Four times a day (QID) | ORAL | 0 refills | Status: AC | PRN
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
## 2021-06-07 NOTE — Discharge Instructions (Addendum)
Take ibuprofen or naproxen as needed for pain.  Use hydrocodone sparingly. -Return to the emergency department anytime if you begin to experience any new or worsening symptoms.
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
## 2022-08-15 IMAGING — CT CT ABD-PELV W/ CM
2 of 5 series · 16 of 46 positions shown, 18 images · IV contrast (APPLIED)
Comparison: None.

CLINICAL DATA: Right lower quadrant abdominal pain for 2 days. Some
nausea and vomiting. Suspect appendicitis

EXAM:
CT ABDOMEN AND PELVIS WITH CONTRAST
TECHNIQUE: Multidetector CT imaging of the abdomen and pelvis was performed
using the standard protocol following bolus administration of
intravenous contrast.
CONTRAST:  80mL OMNIPAQUE IOHEXOL 350 MG/ML SOLN

[Series 2: routine abd/pel with · axial · 0.76mm/px · z∈[+430,+815]mm · 13 of 87 slices shown, 15 images]
[im 5/87  soft-tissue]
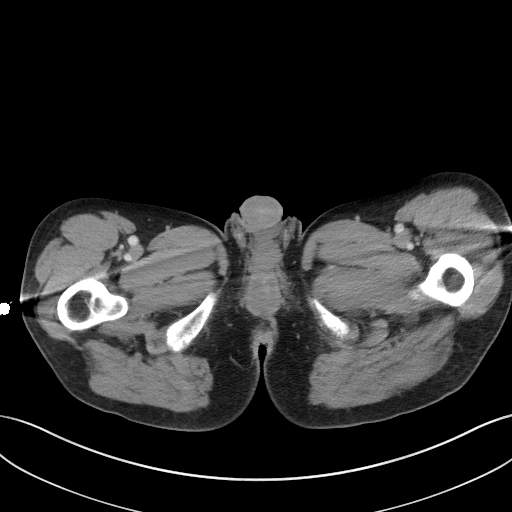
[im 5/87  bone]
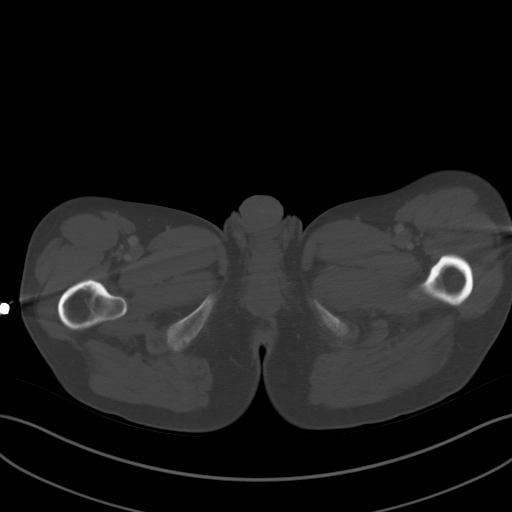
[im 14/87  soft-tissue]
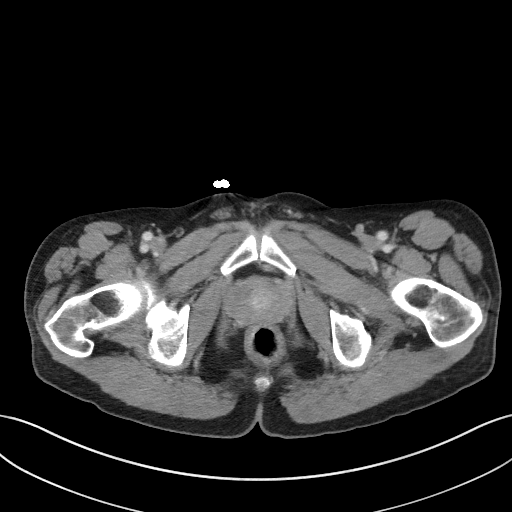
[im 19/87  soft-tissue]
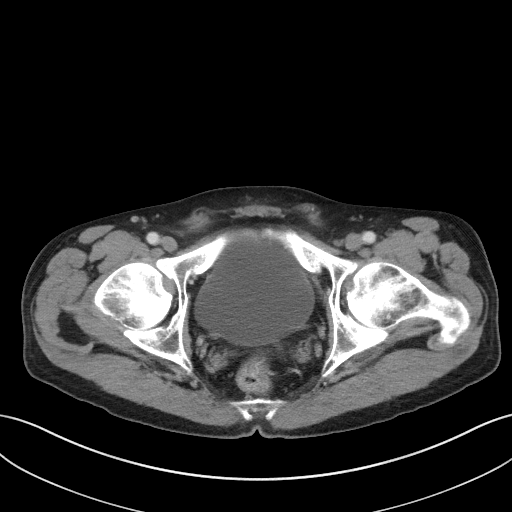
[im 23/87  soft-tissue]
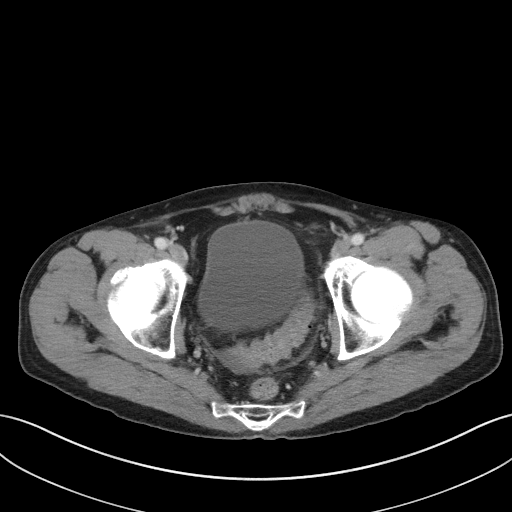
[im 32/87  soft-tissue]
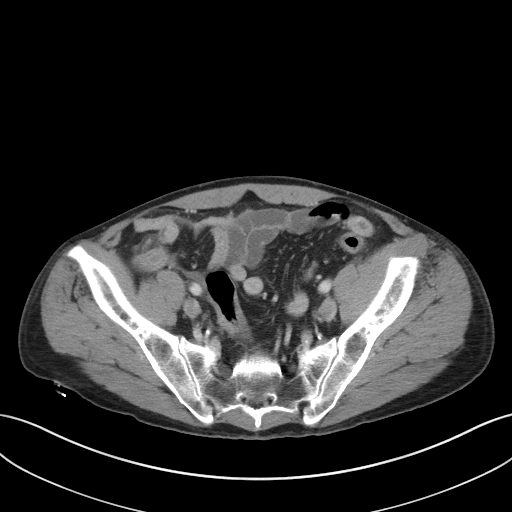
[im 37/87  soft-tissue]
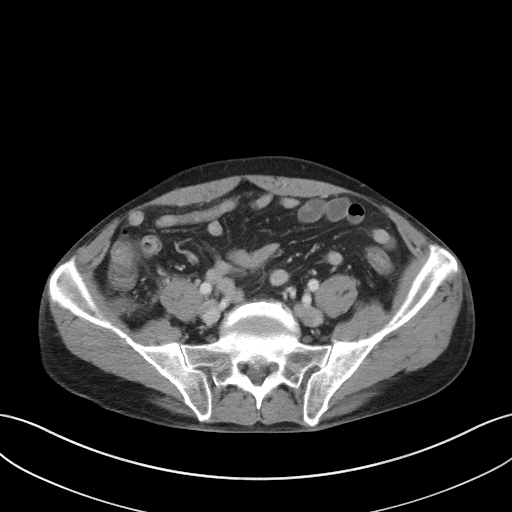
[im 46/87  soft-tissue]
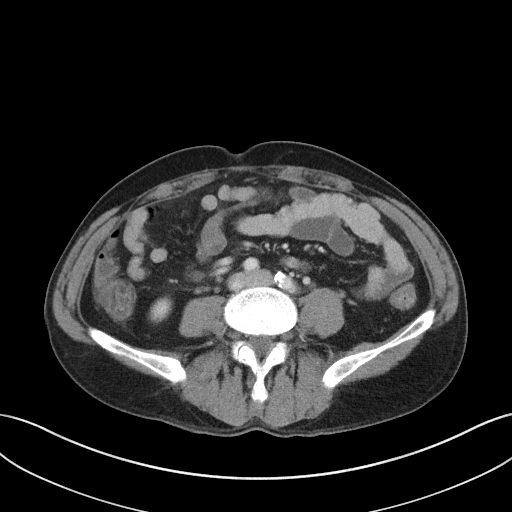
[im 50/87  soft-tissue]
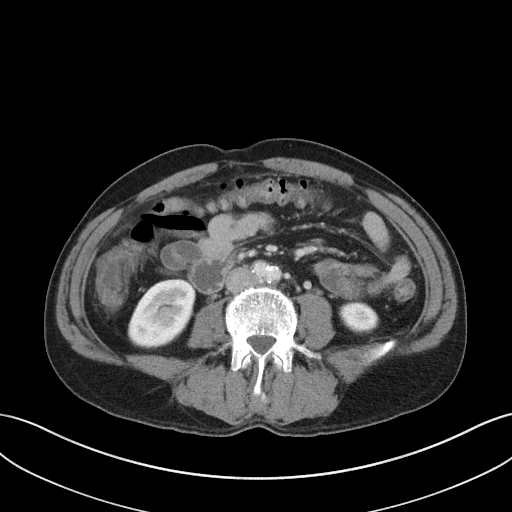
[im 55/87  soft-tissue]
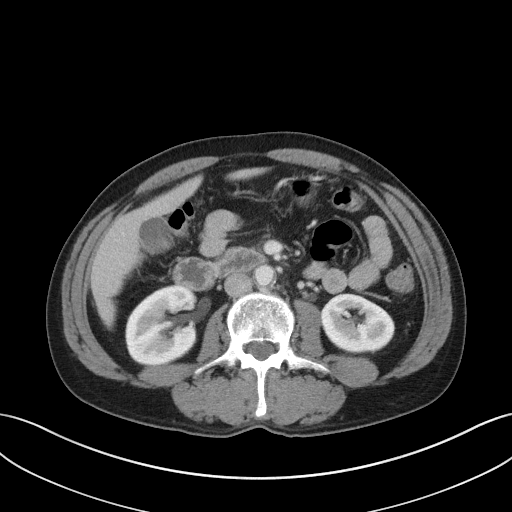
[im 55/87  bone]
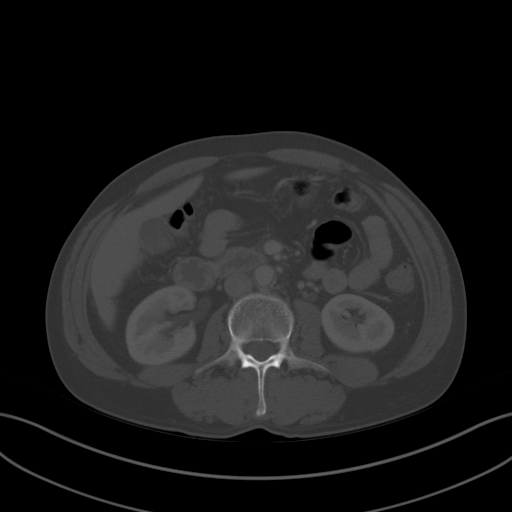
[im 64/87  soft-tissue]
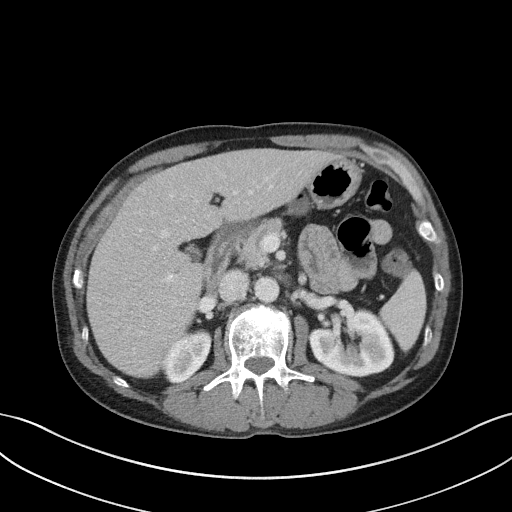
[im 68/87  soft-tissue]
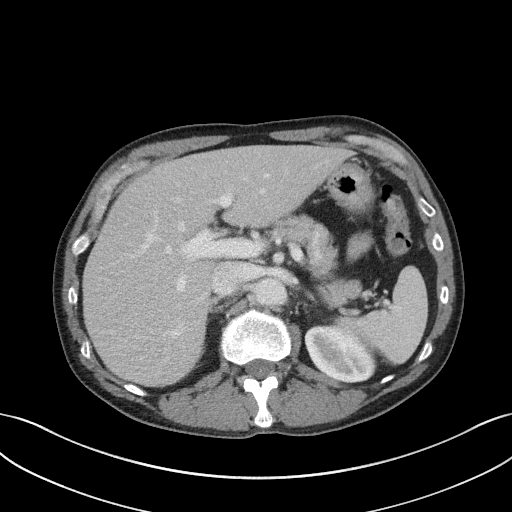
[im 73/87  soft-tissue]
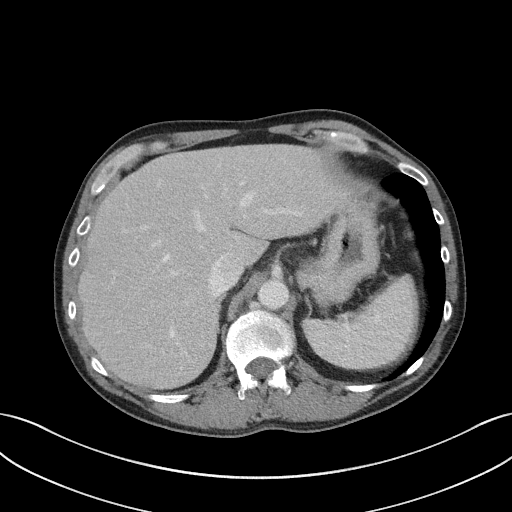
[im 82/87  soft-tissue]
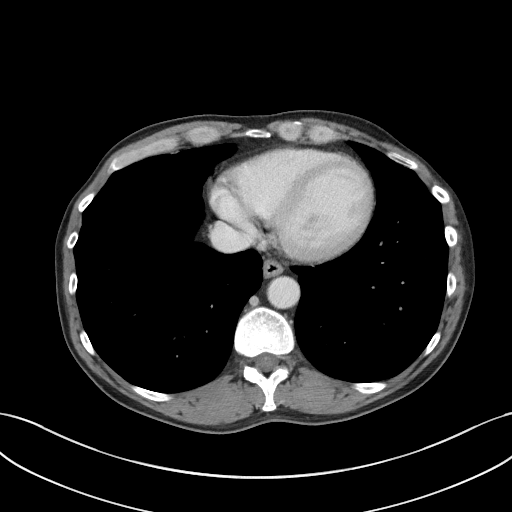

[Series 5: coronal st · coronal · 0.77mm/px · 3 of 85 slices shown]
[im 29/85  soft-tissue]
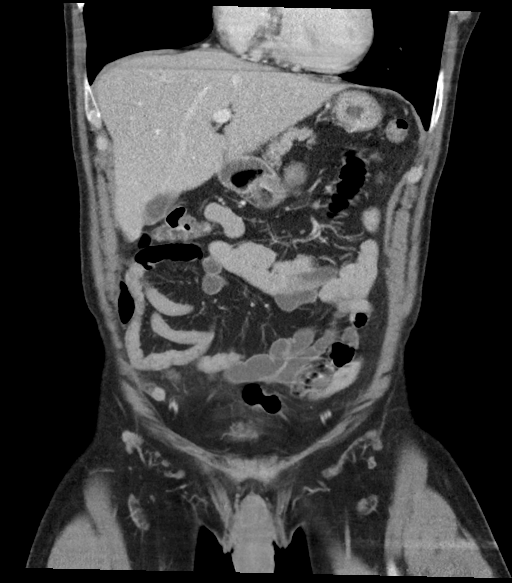
[im 38/85  soft-tissue]
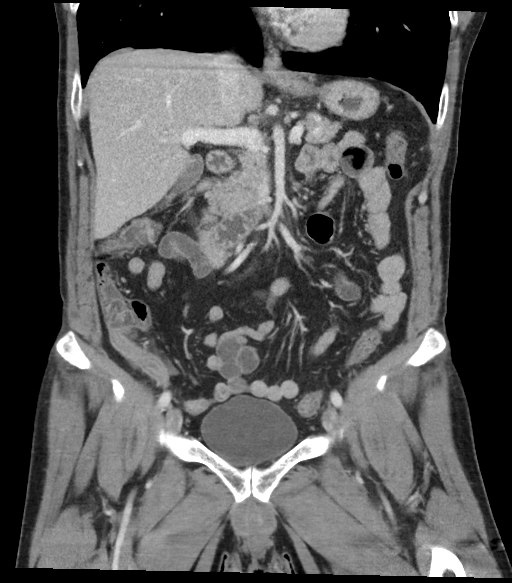
[im 47/85  soft-tissue]
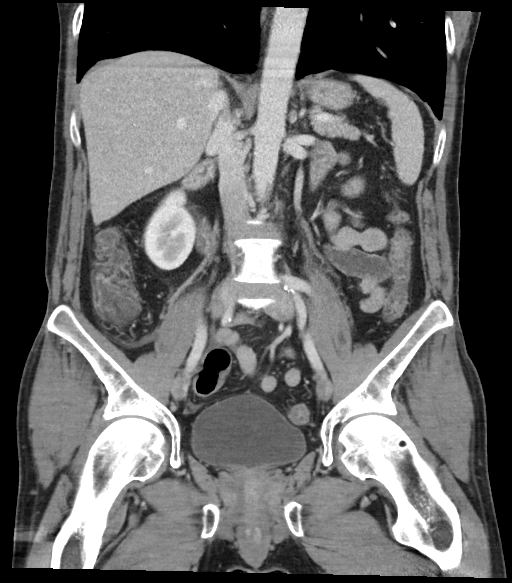

[16 of 46 positions shown; findings below may reference images not displayed]

FINDINGS: Lower chest: No acute abnormality.

Hepatobiliary: No focal liver abnormality is seen. No gallstones,
gallbladder wall thickening, or biliary dilatation.

Pancreas: Unremarkable. No pancreatic ductal dilatation or
surrounding inflammatory changes.

Spleen: Normal in size without focal abnormality.

Adrenals/Urinary Tract: Adrenal glands are unremarkable. Kidneys are
normal, without renal calculi, focal lesion, or hydronephrosis.
Bladder is unremarkable.

Stomach/Bowel: The appendix is dilated measuring up to 1.5 cm in
diameter with periappendiceal fat stranding consistent with acute
appendicitis. No free air to indicate perforation. No focal fluid
collection to suggest abscess. Small amount of free fluid is present
in the pelvic floor.

Colon is normal in appearance. No bowel dilatation to suggest ileus
or obstruction.

Vascular/Lymphatic: No enlarged abdominal or pelvic lymph nodes.
Scattered atherosclerotic calcifications seen throughout the
abdominal aorta and visualized branches.

Reproductive: Mildly enlarged prostate.

Other: Small fat containing umbilical hernia.

Musculoskeletal: Mild compression fracture of the L2 vertebral body
is of uncertain chronicity. Chronic bilateral pars defects seen at
L5.
IMPRESSION: 1. Acute appendicitis without evidence of perforation or abscess.
2. Mild L2 compression fracture of uncertain chronicity.
